# Patient Record
Sex: Female | Born: 1965 | Race: White | Hispanic: No | Marital: Married | Smoking: Former smoker
Health system: Southern US, Community
[De-identification: ages and names within clinical notes are randomized; demographics above are authoritative.]

## PROBLEM LIST (undated history)

## (undated) DIAGNOSIS — F419 Anxiety disorder, unspecified: Secondary | ICD-10-CM

## (undated) DIAGNOSIS — O24419 Gestational diabetes mellitus in pregnancy, unspecified control: Secondary | ICD-10-CM

## (undated) DIAGNOSIS — D219 Benign neoplasm of connective and other soft tissue, unspecified: Secondary | ICD-10-CM

## (undated) HISTORY — DX: Gestational diabetes mellitus in pregnancy, unspecified control: O24.419

## (undated) HISTORY — DX: Anxiety disorder, unspecified: F41.9

## (undated) HISTORY — PX: SKIN GRAFT: SHX250

## (undated) HISTORY — PX: WISDOM TOOTH EXTRACTION: SHX21

## (undated) HISTORY — PX: BREAST ENHANCEMENT SURGERY: SHX7

## (undated) HISTORY — PX: OTHER SURGICAL HISTORY: SHX169

## (undated) HISTORY — PX: ENDOMETRIAL ABLATION W/ NOVASURE: SUR434

## (undated) HISTORY — DX: Benign neoplasm of connective and other soft tissue, unspecified: D21.9

---

## 2001-06-23 ENCOUNTER — Other Ambulatory Visit: Admission: RE | Admit: 2001-06-23 | Discharge: 2001-06-23 | Payer: Self-pay | Admitting: Gynecology

## 2002-02-25 ENCOUNTER — Inpatient Hospital Stay (HOSPITAL_COMMUNITY): Admission: AD | Admit: 2002-02-25 | Discharge: 2002-02-25 | Payer: Self-pay | Admitting: Obstetrics & Gynecology

## 2002-04-28 ENCOUNTER — Encounter: Admission: RE | Admit: 2002-04-28 | Discharge: 2002-04-28 | Payer: Self-pay | Admitting: Obstetrics and Gynecology

## 2002-05-26 ENCOUNTER — Inpatient Hospital Stay (HOSPITAL_COMMUNITY): Admission: AD | Admit: 2002-05-26 | Discharge: 2002-05-28 | Payer: Self-pay | Admitting: Obstetrics and Gynecology

## 2002-06-24 ENCOUNTER — Other Ambulatory Visit: Admission: RE | Admit: 2002-06-24 | Discharge: 2002-06-24 | Payer: Self-pay | Admitting: Obstetrics and Gynecology

## 2003-11-25 ENCOUNTER — Other Ambulatory Visit: Admission: RE | Admit: 2003-11-25 | Discharge: 2003-11-25 | Payer: Self-pay | Admitting: Obstetrics and Gynecology

## 2005-01-01 ENCOUNTER — Other Ambulatory Visit: Admission: RE | Admit: 2005-01-01 | Discharge: 2005-01-01 | Payer: Self-pay | Admitting: Obstetrics and Gynecology

## 2006-11-11 ENCOUNTER — Ambulatory Visit: Payer: Self-pay | Admitting: Family Medicine

## 2006-11-11 DIAGNOSIS — R51 Headache: Secondary | ICD-10-CM

## 2006-11-11 DIAGNOSIS — R519 Headache, unspecified: Secondary | ICD-10-CM | POA: Insufficient documentation

## 2007-08-15 ENCOUNTER — Ambulatory Visit: Payer: Self-pay | Admitting: Family Medicine

## 2007-08-18 ENCOUNTER — Ambulatory Visit: Payer: Self-pay | Admitting: Family Medicine

## 2007-10-10 ENCOUNTER — Encounter: Payer: Self-pay | Admitting: Obstetrics and Gynecology

## 2007-10-10 ENCOUNTER — Ambulatory Visit: Payer: Self-pay | Admitting: Obstetrics and Gynecology

## 2008-10-22 ENCOUNTER — Ambulatory Visit: Payer: Self-pay | Admitting: Obstetrics and Gynecology

## 2008-10-22 ENCOUNTER — Encounter: Payer: Self-pay | Admitting: Obstetrics and Gynecology

## 2009-06-09 ENCOUNTER — Ambulatory Visit: Payer: Self-pay | Admitting: Family Medicine

## 2009-06-13 ENCOUNTER — Ambulatory Visit: Payer: Self-pay | Admitting: Family Medicine

## 2009-07-12 ENCOUNTER — Ambulatory Visit: Payer: Self-pay | Admitting: Family Medicine

## 2009-07-12 LAB — CONVERTED CEMR LAB
Albumin/Creatinine Ratio, Urine, POC: <30
Bilirubin Urine: NEGATIVE
Blood in Urine, dipstick: NEGATIVE
Creatinine,U: 200 mg/dL
Glucose, Urine, Semiquant: NEGATIVE
Ketones, urine, test strip: NEGATIVE
Microalbumin U total vol: 10 mg/L
Nitrite: NEGATIVE
Protein, U semiquant: NEGATIVE
Specific Gravity, Urine: 1.02
Urobilinogen, UA: 0.2
pH: 5.5

## 2009-11-02 ENCOUNTER — Ambulatory Visit: Payer: Self-pay | Admitting: Obstetrics & Gynecology

## 2009-11-03 ENCOUNTER — Encounter: Payer: Self-pay | Admitting: Obstetrics & Gynecology

## 2009-11-03 LAB — CONVERTED CEMR LAB: TSH: 1.738 microintl units/mL (ref 0.350–4.500)

## 2010-03-29 ENCOUNTER — Ambulatory Visit: Payer: Self-pay | Admitting: Obstetrics & Gynecology

## 2010-03-29 LAB — CONVERTED CEMR LAB
Basophils Absolute: 0 10*3/uL (ref 0.0–0.1)
Basophils Relative: 0 % (ref 0–1)
Chlamydia, Swab/Urine, PCR: NEGATIVE
Eosinophils Absolute: 0 10*3/uL (ref 0.0–0.7)
Eosinophils Relative: 0 % (ref 0–5)
GC Probe Amp, Urine: NEGATIVE
HCT: 39.7 % (ref 36.0–46.0)
Hemoglobin: 12.6 g/dL (ref 12.0–15.0)
Lymphocytes Relative: 22 % (ref 12–46)
Lymphs Abs: 1.1 10*3/uL (ref 0.7–4.0)
MCHC: 31.7 g/dL (ref 30.0–36.0)
MCV: 97.5 fL (ref 78.0–100.0)
Monocytes Absolute: 0.4 10*3/uL (ref 0.1–1.0)
Monocytes Relative: 8 % (ref 3–12)
Neutro Abs: 3.4 10*3/uL (ref 1.7–7.7)
Neutrophils Relative %: 70 % (ref 43–77)
Platelets: 165 10*3/uL (ref 150–400)
RBC: 4.07 M/uL (ref 3.87–5.11)
RDW: 14 % (ref 11.5–15.5)
TSH: 2.62 microintl units/mL (ref 0.350–4.500)
WBC: 4.9 10*3/uL (ref 4.0–10.5)

## 2010-03-30 ENCOUNTER — Encounter: Admission: RE | Admit: 2010-03-30 | Discharge: 2010-03-30 | Payer: Self-pay | Admitting: Obstetrics & Gynecology

## 2010-04-05 ENCOUNTER — Ambulatory Visit: Payer: Self-pay | Admitting: Obstetrics & Gynecology

## 2010-04-25 ENCOUNTER — Ambulatory Visit: Payer: Self-pay | Admitting: Obstetrics & Gynecology

## 2010-04-25 ENCOUNTER — Ambulatory Visit (HOSPITAL_COMMUNITY): Admission: RE | Admit: 2010-04-25 | Discharge: 2010-04-25 | Payer: Self-pay | Admitting: Obstetrics & Gynecology

## 2010-05-09 ENCOUNTER — Ambulatory Visit: Payer: Self-pay | Admitting: Family Medicine

## 2010-07-05 ENCOUNTER — Ambulatory Visit: Payer: Self-pay | Admitting: Obstetrics & Gynecology

## 2010-09-16 ENCOUNTER — Encounter: Payer: Self-pay | Admitting: Obstetrics and Gynecology

## 2010-09-17 ENCOUNTER — Encounter: Payer: Self-pay | Admitting: Obstetrics & Gynecology

## 2010-09-26 NOTE — Assessment & Plan Note (Signed)
Summary: TB skin test , flu shot-jr  Nurse Visit   Vitals Entered By: Payton Spark CMA (May 09, 2010 9:08 AM)  Allergies: No Known Drug Allergies  Immunizations Administered:  PPD Skin Test:    Vaccine Type: PPD    Site: left forearm    Dose: 0.1 ml    Route: ID    Given by: Payton Spark CMA  Orders Added: 1)  TB Skin Test [86580] 2)  Admin 1st Vaccine [90471] 3)  Admin 1st Vaccine [90471] 4)  Flu Vaccine 60yrs + [98119]   Flu Vaccine Consent Questions     Do you have a history of severe allergic reactions to this vaccine? no    Any prior history of allergic reactions to egg and/or gelatin? no    Do you have a sensitivity to the preservative Thimersol? no    Do you have a past history of Guillan-Barre Syndrome? no    Do you currently have an acute febrile illness? no    Have you ever had a severe reaction to latex? no    Vaccine information given and explained to patient? yes    Are you currently pregnant? no    Lot Number:[id number]   Exp Date:02/24/2011   Site Given  Left Deltoid IM   Appended Document: TB skin test , flu shot-jr   PPD Results    Date of reading: 05/11/2010    Results: < 5mm    Interpretation: negative

## 2010-11-09 LAB — CBC
HCT: 36.8 % (ref 36.0–46.0)
MCH: 32.6 pg (ref 26.0–34.0)
MCHC: 34 g/dL (ref 30.0–36.0)
MCV: 95.9 fL (ref 78.0–100.0)
Platelets: 151 10*3/uL (ref 150–400)
RDW: 13.4 % (ref 11.5–15.5)
WBC: 4.3 10*3/uL (ref 4.0–10.5)

## 2011-01-09 NOTE — Assessment & Plan Note (Signed)
NAME:  Ashley Barton, Ashley Barton NO.:  [id number]   MEDICAL RECORD NO.:  [id number]          PATIENT TYPE:  POB   LOCATION:  [facility] at [place]         FACILITY:  [facility]   PHYSICIAN:  Allie Bossier, MD        DATE OF BIRTH:  Apr 23, 1966   DATE OF SERVICE:  03/29/2010                                  CLINIC NOTE   [name] is a 45 year old married white, G2, P2, she has sons, 63 and 38  years of age.  She comes in because over the last 6 months to a year her  periods have become much more heavy.  She says that they used to be 4-5  days in length, now they are 7-10 days in length.  Now very heavy, when  she stands she feels clot.  She has been doing research and has decided  she would like an endometrial ablation. On exam, her uterus is normal  size and shape, anteverted mobile.  Her adnexa are nontender, no masses.  I have explained that we will need to do a workup prior to scheduling  any surgery.  This workup for menorrhagia will include a CBC, TSH,  cervical cultures, and a GYN ultrasound.  She is in agreement and I will  see her back when these results are available.      Allie Bossier, MD     MCD/MEDQ  D:  03/29/2010  T:  03/29/2010  Job:  [id number]

## 2011-01-09 NOTE — Assessment & Plan Note (Signed)
NAME:  Ashley Barton, Ashley Barton                 ACCOUNT NO.:  [id number]   MEDICAL RECORD NO.:  [id number]          PATIENT TYPE:  POB   LOCATION:  [facility] at [place]         FACILITY:  [facility]   PHYSICIAN:  Allie Bossier, MD        DATE OF BIRTH:  October 27, 1965   DATE OF SERVICE:  04/05/2010                                  CLINIC NOTE   Ms. Spivey is a 45 year old married white G2, P2.  Her sons are 44 and 66  years of age.  She has been seen at the [place] office for several  years.  Earlier this month, she came in complaining that her periods  over the last 6 months have begun to become much more heavy.  Initially,  they used to be 4-5 days in length and now they are 7-10 days in length.  When she stands, she feels clots coming out.  She had done her research  before she came in and wishes to have an endometrial ablation.  At that  time, I began a workup for menorrhagia and found that her hemoglobin is  on the lower limits of normal at 12.6.  Her TSH is normal.  Her cervical  cultures are negative, and a GYN ultrasound shows a 2.5-cm left fundal  fibroid as well as an endometrial polyp.  I have recommended that she  have a D and C and hysteroscopy and then a NovaSure endometrial  ablation.  She understands the risks associated with surgery and wishes  to proceed.   PAST MEDICAL HISTORY:  Hemorrhoids and anxiety.   REVIEW OF SYSTEMS:  She is married for the last 18 years.  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  Her husband is  status post vasectomy.   PREVIOUS SURGICAL HISTORY:  Wisdom teeth extraction, skin graft to her  right palm, and saline breast implants in 2006.   No drug allergies.  No latex allergies.  No food allergies.   PHYSICAL EXAMINATION:  GENERAL:  Well-nourished, well-hydrated, and very  pleasant white female.  VITAL SIGNS:  Height 5 feet 2 inches, weight 118, blood pressure 98/65,  pulse 89.  HEENT:  Normal.  HEART:  Regular rate and rhythm.  LUNGS:  Clear to auscultation bilaterally.  ABDOMEN:  Benign, scaphoid.  BREASTS:  Normal bilaterally.  EXTERNAL GENITALIA:  Normal.  Bimanual exam; her uterus is normal size  and shape, anteverted and mobile.  Her adnexa are nontender and no  masses.   ASSESSMENT AND PLAN:  Uterine polyp and menorrhagia.  We will plan for a  D and C, hysteroscopy, and NovaSure endometrial ablation.      Allie Bossier, MD     MCD/MEDQ  D:  04/05/2010  T:  04/06/2010  Job:  [id number]

## 2011-01-09 NOTE — Assessment & Plan Note (Signed)
NAME:  Ashley Barton, Ashley Barton                 ACCOUNT NO.:  [id number]   MEDICAL RECORD NO.:  [id number]          PATIENT TYPE:  POB   LOCATION:  [facility] [place]         FACILITY:  [facility]   PHYSICIAN:  Caren Griffins, CNM       DATE OF BIRTH:  [birth year]   DATE OF SERVICE:  10/10/2007                                  CLINIC NOTE   REASON FOR VISIT:  Annual GYN.   HISTORY:  Ashley Barton is a 45 year old G2, P2 who is here for yearly exam.  She  has some concerns with her hemorrhoids which she says protrude about  half the time, and she manually replaces after bowel movement.  Of note,  she had a hemorrhoidectomy about 5 years ago.  She denies rectal  bleeding.  She is also concerned that she has slight urinary leaking  when she coughs or laughs; however, she does not routinely have  embarrassment or problems with this and does not wear a pad.  She does  not do Kegel exercises.   ALLERGIES:  None.   CURRENT MEDICATIONS:  None, other than occasional use of a multivitamin  and calcium which she is not taking on a regular basis.   IMMUNIZATIONS:  Usual childhood immunizations.  She has had flu shot  this year.   MENSTRUAL HISTORY:  Menses 13 x 28 x 7 with moderate flow.  Rare  dyspareunia.  She does note a lot of mid cycle clear mucoid discharge.   OB HISTORY:  Children are aged 5 and 103 and well.  SVD x2.   CONTRACEPTIVE HISTORY:  Husband has had a vasectomy.   GYN HISTORY:  Last Pap smear was normal in 2007.  She has had all normal  Paps, no STIs.  She had a normal mammogram December of 2006.   SURGERIES:  She had breast implants done January 2007.  Also  hemorrhoidectomy after her second delivery.   FAMILY HISTORY:  Unknown as she is adopted.   PAST MEDICAL HISTORY:  All negative.   SOCIAL HISTORY:  Lives with husband and 2 children.  Does not work  outside the home.  Negative for tobacco, alcohol, or illicit drugs.  No  history of abuse.   Her review of systems are negative except as  noted above.   PHYSICAL EXAMINATION:  VITAL SIGNS:  BP 98/64, weight 111, height 62  inches.  BMI normal.  GENERAL:  WN WD, pleasant female, NAD.  HEENT:  Good dentition, no thyromegaly, no lymphadenopathy.  CORONARY:  RRR without murmur.  LUNGS:  Clear to auscultation bilateral.  BREASTS:  Saline implants are present.  There is no lymphadenopathy, no  mass appreciated, no nipple discharge.  ABDOMEN:  Soft, nontender, no organomegaly.  EXTREMITIES:  No edema or varicosities.  PELVIC:  NEFG.  Negative BSU.  Vagina, good support, fair tone.  Physiologic discharge.  Cervix, slight ectropion.  Parous os, no  lesions.  Bimanual:  Uterus NSSP, no adnexal tenderness or masses.   ASSESSMENT:  Normal GYN.  Hemorrhoids by history.   PLAN:  The patient is scheduled for a mammogram.  She is concerned about  breast compression effects on her implants, and  will discuss this with  the technician.  Pap smear is sent.  Discussed treatment for hemorrhoids  at length, stressing dietary measures to keep stools soft.  Also may use  fiber supplements or over-the-counter stool softeners.  Discussed Anusol  or Proctofoam HC use.  Also instructed in Kegel exercises and bladder  training to void every 2 hours if possible.  Discussed her diet which is  essentially good, but she will increase fruits and vegetables.  Discuss  initiation of some kind of cardio strength exercise regimen.  Also seat  belt use, use of sunscreen, and addition of multivitamin with D3 and  calcium supplementation to her diet.           ______________________________  Caren Griffins, CNM     DP/MEDQ  D:  10/10/2007  T:  10/11/2007  Job:  [id number]

## 2011-01-09 NOTE — Assessment & Plan Note (Signed)
NAME:  Ashley Barton, Ashley Barton                 ACCOUNT NO.:  [id number]   MEDICAL RECORD NO.:  [id number]          PATIENT TYPE:  POB   LOCATION:  [facility] [place]         FACILITY:  [facility]   PHYSICIAN:  Allie Bossier, MD        DATE OF BIRTH:  1966/08/25   DATE OF SERVICE:  11/02/2009                                  CLINIC NOTE   Ashley Barton is a 45 year old married white gravida 2, para 2.  She has 50 and 72-  year-old sons.  She comes in here for annual exam.  She has 2  complaints, one is that she has episodes of not being able to regulate  her temperature, this happens approximately a daily basis and it is very  short time frame.  So, sometimes she will feel like a hot flash and  other times she will feel chilled.  The other complaint is that of  agitation.  She feels like she is not the type of mother she wants to  be currently.  She thinks this is probably due to stress.  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   PAST MEDICAL HISTORY:  Hemorrhoids.   PAST SURGICAL HISTORY:  Wisdom teeth extraction, skin graft to her right  thumb, and saline breast implants in 2006.   REVIEW OF SYSTEMS:  She is married for 18 years.  She is a Futures trader and  a Physicist, medical.  Her husband has had a vasectomy.   MEDICATIONS:  She takes vitamin B and vitamin D.   ALLERGIES:  No known drug allergies.  No latex allergies.   FAMILY HISTORY:  She is adopted.   PHYSICAL EXAMINATION:  GENERAL:  Well-nourished, well-hydrated pleasant  white female.  VITAL SIGNS:  Height 5 feet 2 inches, weight 111, blood pressure 100/62,  pulse 81.  HEENT:  Normal.  BREASTS:  Normal with implants.  ABDOMEN:  Benign, scaphoid.  No palpable hepatosplenomegaly.  PELVIC:  External genitalia, no lesions.  Cervix; parous, no lesions.  Normal discharge in her vagina.  Uterus is normal size and shape,  anteverted and mobile.  Adnexa nontender.  No masses.   ASSESSMENT AND PLAN:  1. Annual exam, checked Pap smear.   Recommend self-breast and self-      vulvar exams.  2. Agitation.  I have recommended that if she had the time, I would      suggest yoga, meditation or just plain exercise to relieve the      stress.  However, she says that she does not have any time for this      and would prefer to try a medical approach.  I have given her      prescription for Prozac 20 mg to be taken every morning and I will      see her in 2 months to see if this is suiting her.  With regard to      her hot/cold episodes, I am checking the TSH.  If that is normal, I      have given her reassurance that she is normal.      Allie Bossier, MD  MCD/MEDQ  D:  11/02/2009  T:  11/03/2009  Job:  [id number]

## 2011-01-09 NOTE — Assessment & Plan Note (Signed)
NAME:  Ashley Barton, Ashley Barton                 ACCOUNT NO.:  [id number]   MEDICAL RECORD NO.:  [id number]          PATIENT TYPE:  POB   LOCATION:  [facility] [place]         FACILITY:  [facility]   PHYSICIAN:  Caren Griffins, CNM       DATE OF BIRTH:  25-Aug-1966   DATE OF SERVICE:  10/22/2008                                  CLINIC NOTE   HISTORY:  Ashley Barton is a 45 year old G2, P2 who has no complaints and is here  for annual Pap and physical.  She was seen here a year ago for her  annual exam, at which time, she had some issues with hemorrhoids, but  denies any problems now with that.  She is currently in nursing school.  Her children age 60 and 6 are both doing well.  She denies any GYN  complaints, has poor relational issues.  LMP was normal October 11, 2008.  Her cycles are still very regular.  She is taking a multivitamin,  calcium, and vitamin D, and does exercise as much as possible with her  busy schedule.  Of note, she declined to have a mammogram and is aware  of the new guidelines.   PHYSICAL EXAMINATION:  VITAL SIGNS:  Pulse 105, BP 115/68, weight 105.  GENERAL:  WN, WD, NAD.  HEENT:  No thyromegaly.  CORONARY:  RRR without murmur.  LUNGS:  CTA bilateral.  BREASTS:  No appreciated masses.  No nipple discharge.  No  lymphadenopathy.  Saline implants at present.  ABDOMEN:  Soft, nontender.  No organomegaly.  EXTREMITIES:  No edema.  PELVIC:  NEG.  BSU negative.  Vagina good support and tone.  Physiologic  discharge.  Cervix without lesions, parous os.  Bimanual uterus NSSP.  No adnexal tenderness or masses.   ASSESSMENT:  Normal gynecologic exam.   PLAN:  Pap smear was sent.  Discussed diet to increase red meat and  empty calorie foods.  Cardio and strength exercises.  Continue vitamins  and D3.  Continue seatbelt use.  We will see her again in a year for an  annual GYN exam and we will discuss whether she will do a Pap smear at  that visit.           ______________________________  Caren Griffins, CNM     DP/MEDQ  D:  10/22/2008  T:  10/22/2008  Job:  [id number]

## 2011-03-18 ENCOUNTER — Encounter: Payer: Self-pay | Admitting: Family Medicine

## 2011-03-20 ENCOUNTER — Ambulatory Visit: Payer: Self-pay | Admitting: Family Medicine

## 2011-03-21 ENCOUNTER — Ambulatory Visit
Admission: RE | Admit: 2011-03-21 | Discharge: 2011-03-21 | Disposition: A | Discharge status: Home / Self Care | Payer: Self-pay | Source: Ambulatory Visit | Attending: Sports Medicine | Admitting: Sports Medicine

## 2011-03-21 ENCOUNTER — Other Ambulatory Visit: Payer: Self-pay | Admitting: Sports Medicine

## 2011-03-21 DIAGNOSIS — M79643 Pain in unspecified hand: Secondary | ICD-10-CM

## 2011-05-20 IMAGING — US US TRANSVAGINAL NON-OB
1 series · 13 of 25 positions shown · non-contrast
Comparison: None.

CLINICAL DATA: Painful menses.  LMP 03/15/2010



[Series 1: us transvaginal non-ob · 0.26mm/px · 13 of 53 slices shown]
[im 1/53]
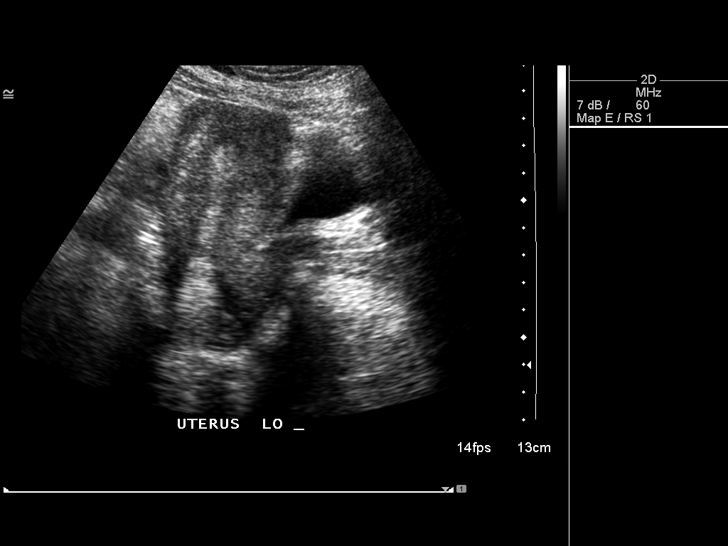
[im 5/53]
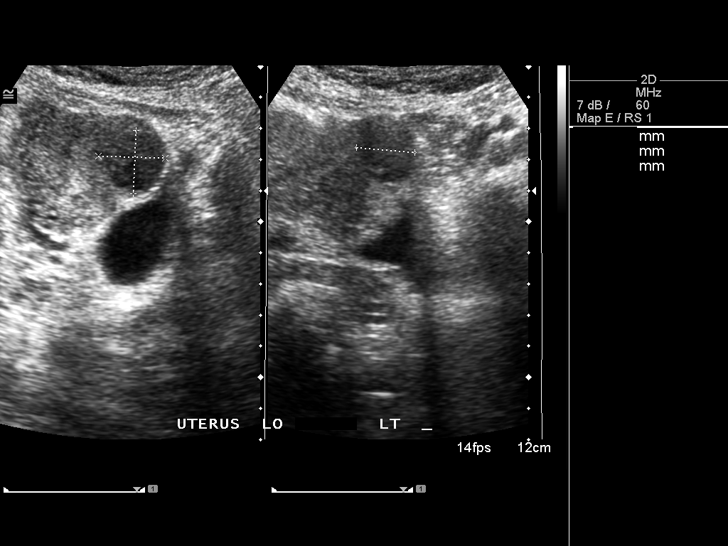
[im 9/53]
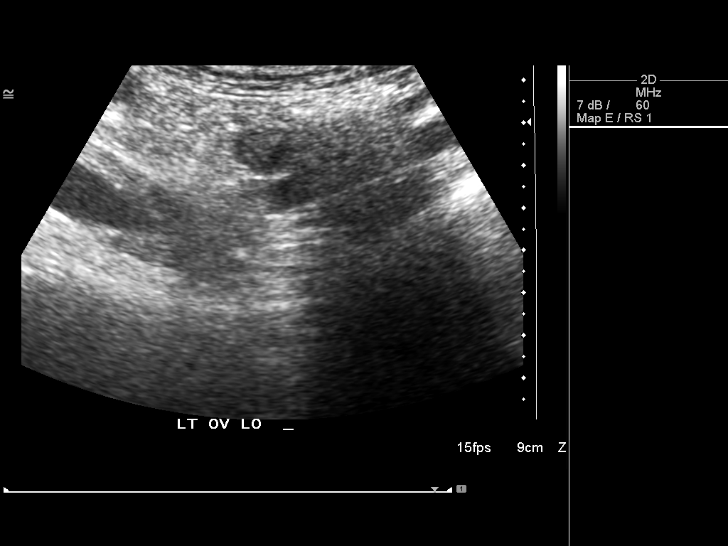
[im 14/53]
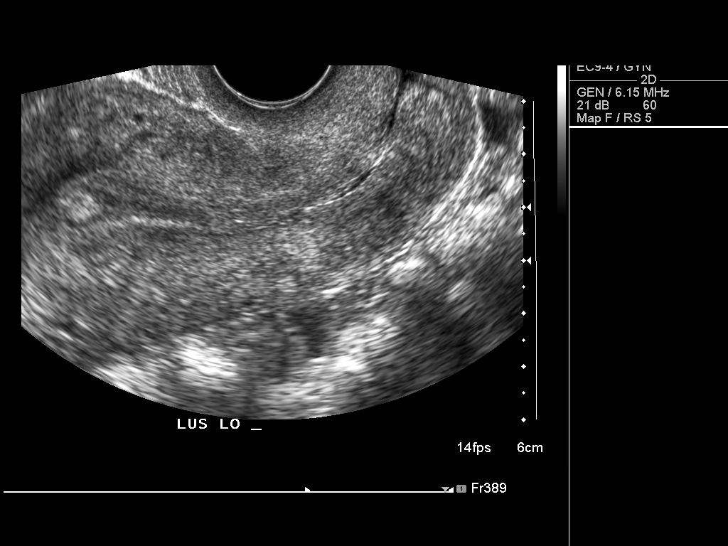
[im 18/53]
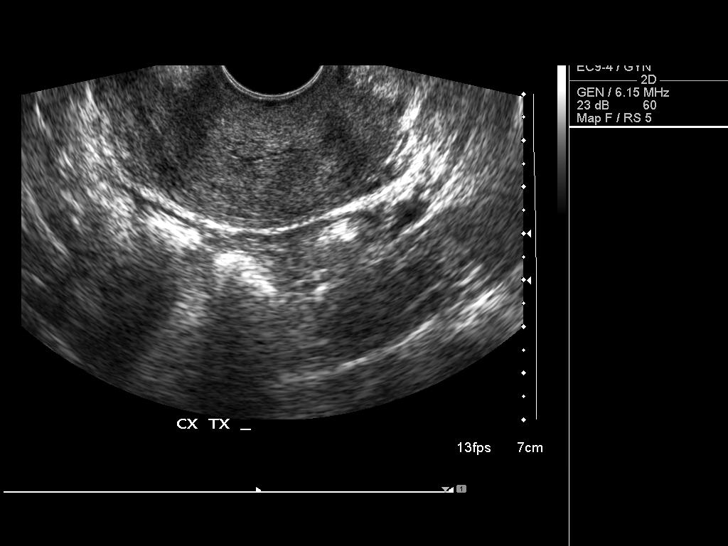
[im 22/53]
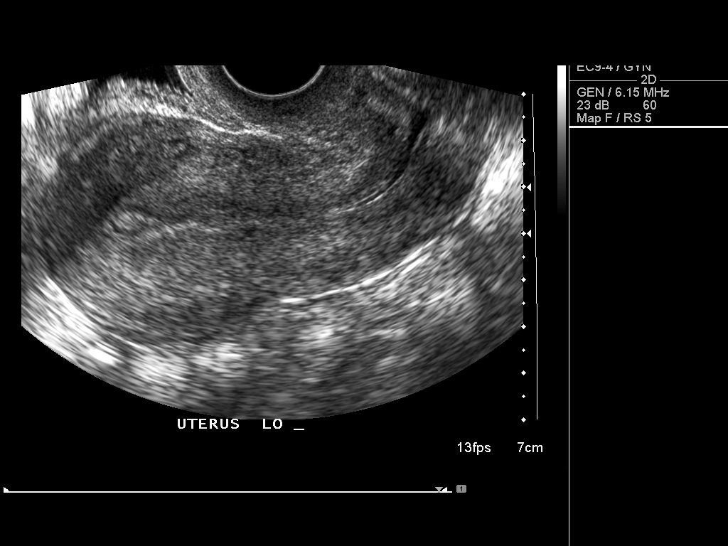
[im 27/53]
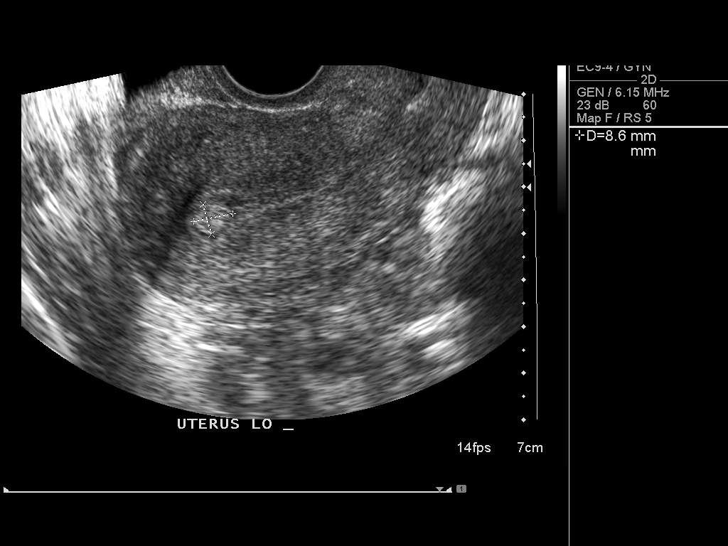
[im 31/53]
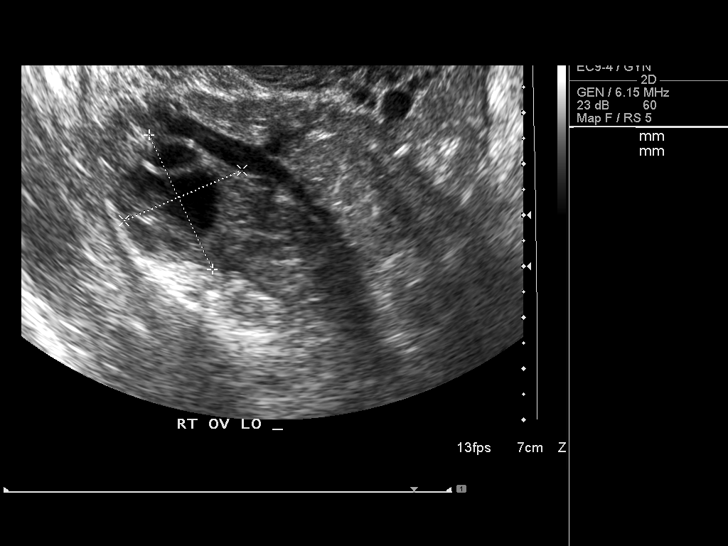
[im 35/53]
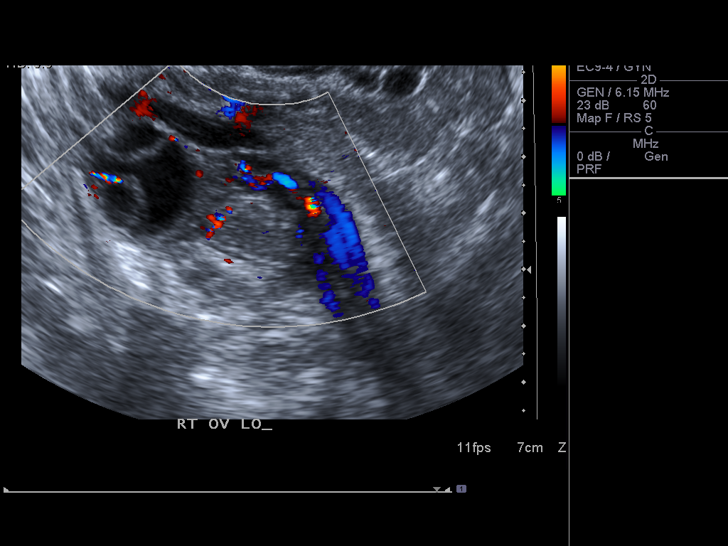
[im 40/53]
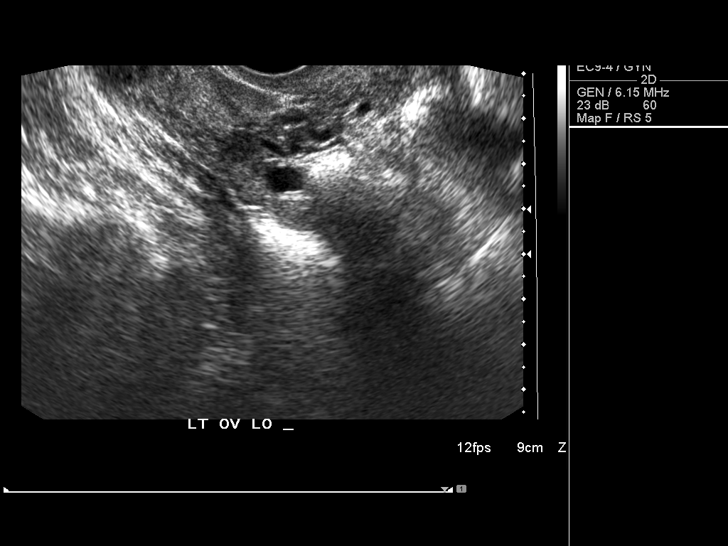
[im 44/53]
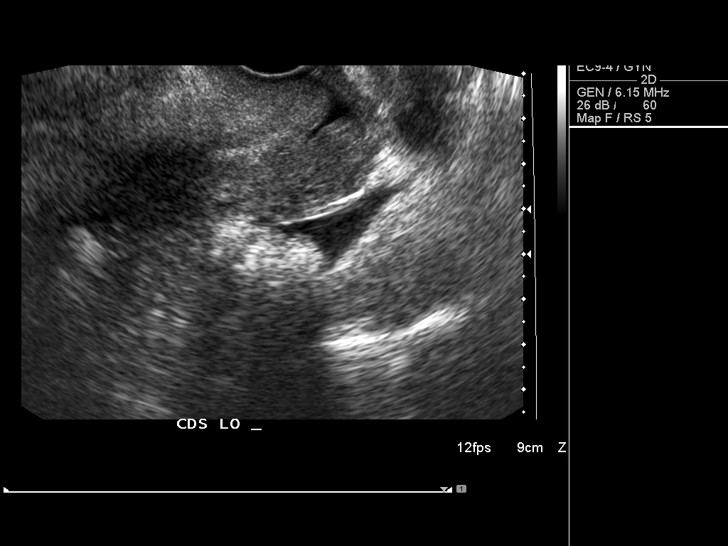
[im 48/53]
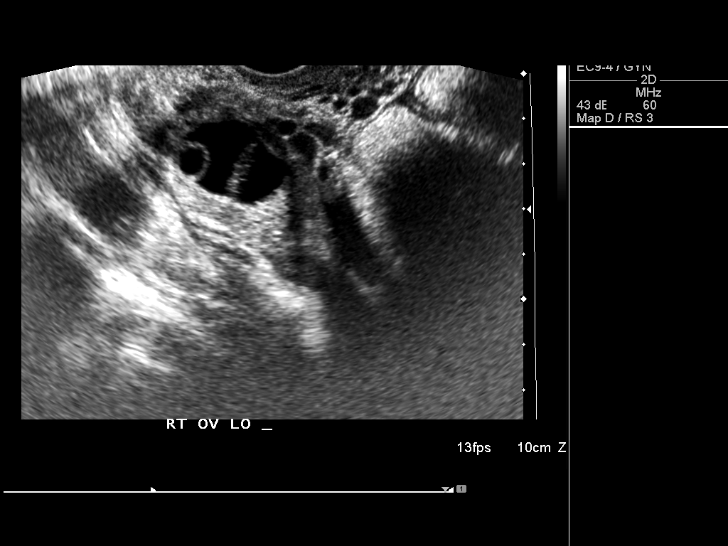
[im 53/53]
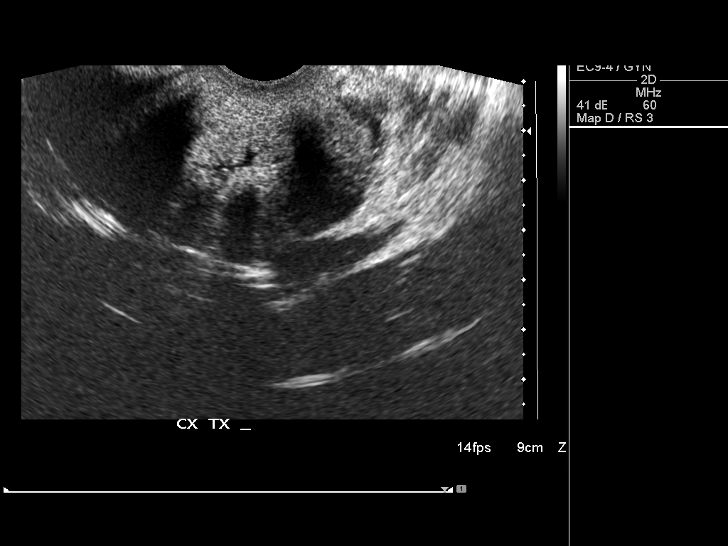

[13 of 25 positions shown; findings below may reference images not displayed]

FINDINGS: Uterus demonstrates a sagittal length of 9.6 cm, an AP depth of
cm and a transverse width of 6.3 cm.  A focal fibroid is identified
in the left fundal region measuring 2.4 x 2.2 x 1.9 cm and mural in
location.  The remainder of the myometrium is homogeneous.

Endometrium has a tri-layered appearance with an AP width of
cm.  An area of focal increased echogenicity is identified in the
fundal region measuring 1.2 x 0.9 x 0.7 cm.  This demonstrates some
intralesional flow  and is strongly suspicious for focal polyp.
This can be confirmed with sono hysterography if desired.

Right Ovary has a normal appearance measuring 2.9 x 2.9 x 2.5 cm

Left Ovary has a normal appearance measuring 2.5 x 1.6 x 1.6 cm.

Other Findings:  A small amount of simple free fluid is noted in
the cul-de-sac.
IMPRESSION: Focal fibroid with size and location as noted above.

Area of focal thickening in the endometrium strongly suspicious for
a polyp.  This can be confirmed with sonohysterography and the
point of attachment to the adjacent endometrium can be determined.

Normal ovaries.

## 2011-07-16 ENCOUNTER — Encounter: Payer: Self-pay | Admitting: *Deleted

## 2011-07-16 ENCOUNTER — Other Ambulatory Visit: Payer: Self-pay | Admitting: *Deleted

## 2011-07-16 DIAGNOSIS — F419 Anxiety disorder, unspecified: Secondary | ICD-10-CM

## 2011-07-16 MED ORDER — FLUOXETINE HCL 20 MG PO CAPS: 20.0000 mg | ORAL_CAPSULE | Freq: Every day | ORAL | Status: DC

## 2011-08-01 ENCOUNTER — Encounter: Payer: Self-pay | Admitting: Obstetrics & Gynecology

## 2011-08-01 ENCOUNTER — Ambulatory Visit (INDEPENDENT_AMBULATORY_CARE_PROVIDER_SITE_OTHER): Payer: 59 | Admitting: Obstetrics & Gynecology

## 2011-08-01 VITALS — BP 118/79 | HR 81 | Temp 97.7°F | Resp 16 | Ht 62.0 in | Wt 132.0 lb

## 2011-08-01 DIAGNOSIS — Z01419 Encounter for gynecological examination (general) (routine) without abnormal findings: Secondary | ICD-10-CM

## 2011-08-01 DIAGNOSIS — IMO0001 Reserved for inherently not codable concepts without codable children: Secondary | ICD-10-CM

## 2011-08-01 NOTE — Progress Notes (Signed)
  Subjective:     Ashley Barton is a 45 y.o. female here for a routine exam.  Current complaints: none.  Personal health questionnaire reviewed: yes.   Gynecologic History Patient's last menstrual period was 07/02/2011. Last Pap: 2011. Results were: normal Last mammogram: 2012. Results were: normal  Obstetric History OB History    Grav Para Term Preterm Abortions TAB SAB Ect Mult Living   2 2        2      # Outc Date GA Lbr Len/2nd Wgt Sex Del Anes PTL Lv   1 PAR            2 PAR                The following portions of the patient's history were reviewed and updated as appropriate: allergies, current medications, past family history, past medical history, past social history, past surgical history and problem list.  Review of Systems Constitutional: negative Ears, nose, mouth, throat, and face: negative Respiratory: negative Cardiovascular: negative Gastrointestinal: negative Genitourinary:negative Integument/breast: negative Behavioral/Psych: negative    Objective:    BP 118/79  Pulse 81  Temp(Src) 97.7 F (36.5 C) (Oral)  Resp 16  Ht 5\' 2"  (1.575 m)  Wt 59.875 kg (132 lb)  BMI 24.14 kg/m2  LMP 07/02/2011 General appearance: alert, cooperative and no distress Head: Normocephalic, without obvious abnormality, atraumatic Throat: lips, mucosa, and tongue normal; teeth and gums normal Neck: no adenopathy, supple, symmetrical, trachea midline and thyroid not enlarged, symmetric, no tenderness/mass/nodules Lungs: clear to auscultation bilaterally Breasts: normal appearance, no masses or tenderness Heart: regular rate and rhythm Abdomen: soft, non-tender; bowel sounds normal; no masses,  no organomegaly Pelvic: cervix normal in appearance, external genitalia normal, no adnexal masses or tenderness, no cervical motion tenderness, rectovaginal septum normal, uterus normal size, shape, and consistency and vagina normal without discharge Extremities: extremities normal,  atraumatic, no cyanosis or edema Skin: Skin color, texture, turgor normal. No rashes or lesions    Assessment:    Healthy female exam.    Plan:   No pap smear indicated, low risk RTC 1 yr. 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

## 2011-08-01 NOTE — Patient Instructions (Signed)
Calcium Intake Recommendations Age group / Amount of calcium to consume daily, in milligrams (mg)  0 to 6 months / 210 mg   7 to 12 months / 270 mg   1 to 3 years / 500 mg   4 to 8 years / 800 mg   9 to 18 years / 1,300 mg   19 to 50 years / 1,000 mg   51 to 70+ years / 1,200 mg   Pregnant and nursing, under 19 years / 1,300 mg   Pregnant and nursing, over 19 years / 1,000 mg  Document Released: 03/27/2004 Document Revised: 04/25/2011 Document Reviewed: 08/13/2005 [facility] Patient Information 2012 [place], [place].

## 2011-08-08 ENCOUNTER — Other Ambulatory Visit: Payer: Self-pay | Admitting: *Deleted

## 2011-08-08 DIAGNOSIS — F419 Anxiety disorder, unspecified: Secondary | ICD-10-CM

## 2011-08-08 MED ORDER — FLUOXETINE HCL 20 MG PO CAPS: 20.0000 mg | ORAL_CAPSULE | Freq: Every day | ORAL | Status: DC

## 2012-03-21 ENCOUNTER — Other Ambulatory Visit: Payer: Self-pay | Admitting: *Deleted

## 2012-03-21 DIAGNOSIS — F419 Anxiety disorder, unspecified: Secondary | ICD-10-CM

## 2012-03-21 MED ORDER — FLUOXETINE HCL 20 MG PO CAPS: 20.0000 mg | ORAL_CAPSULE | Freq: Every day | ORAL | Status: DC

## 2012-05-10 IMAGING — CR DG HAND 2V*R*
1 series · 1 of 1 positions shown · non-contrast
Comparison: None.

CLINICAL DATA: Swelling of the right second digit, no trauma.

RIGHT HAND - 2 VIEW

[view not recorded]
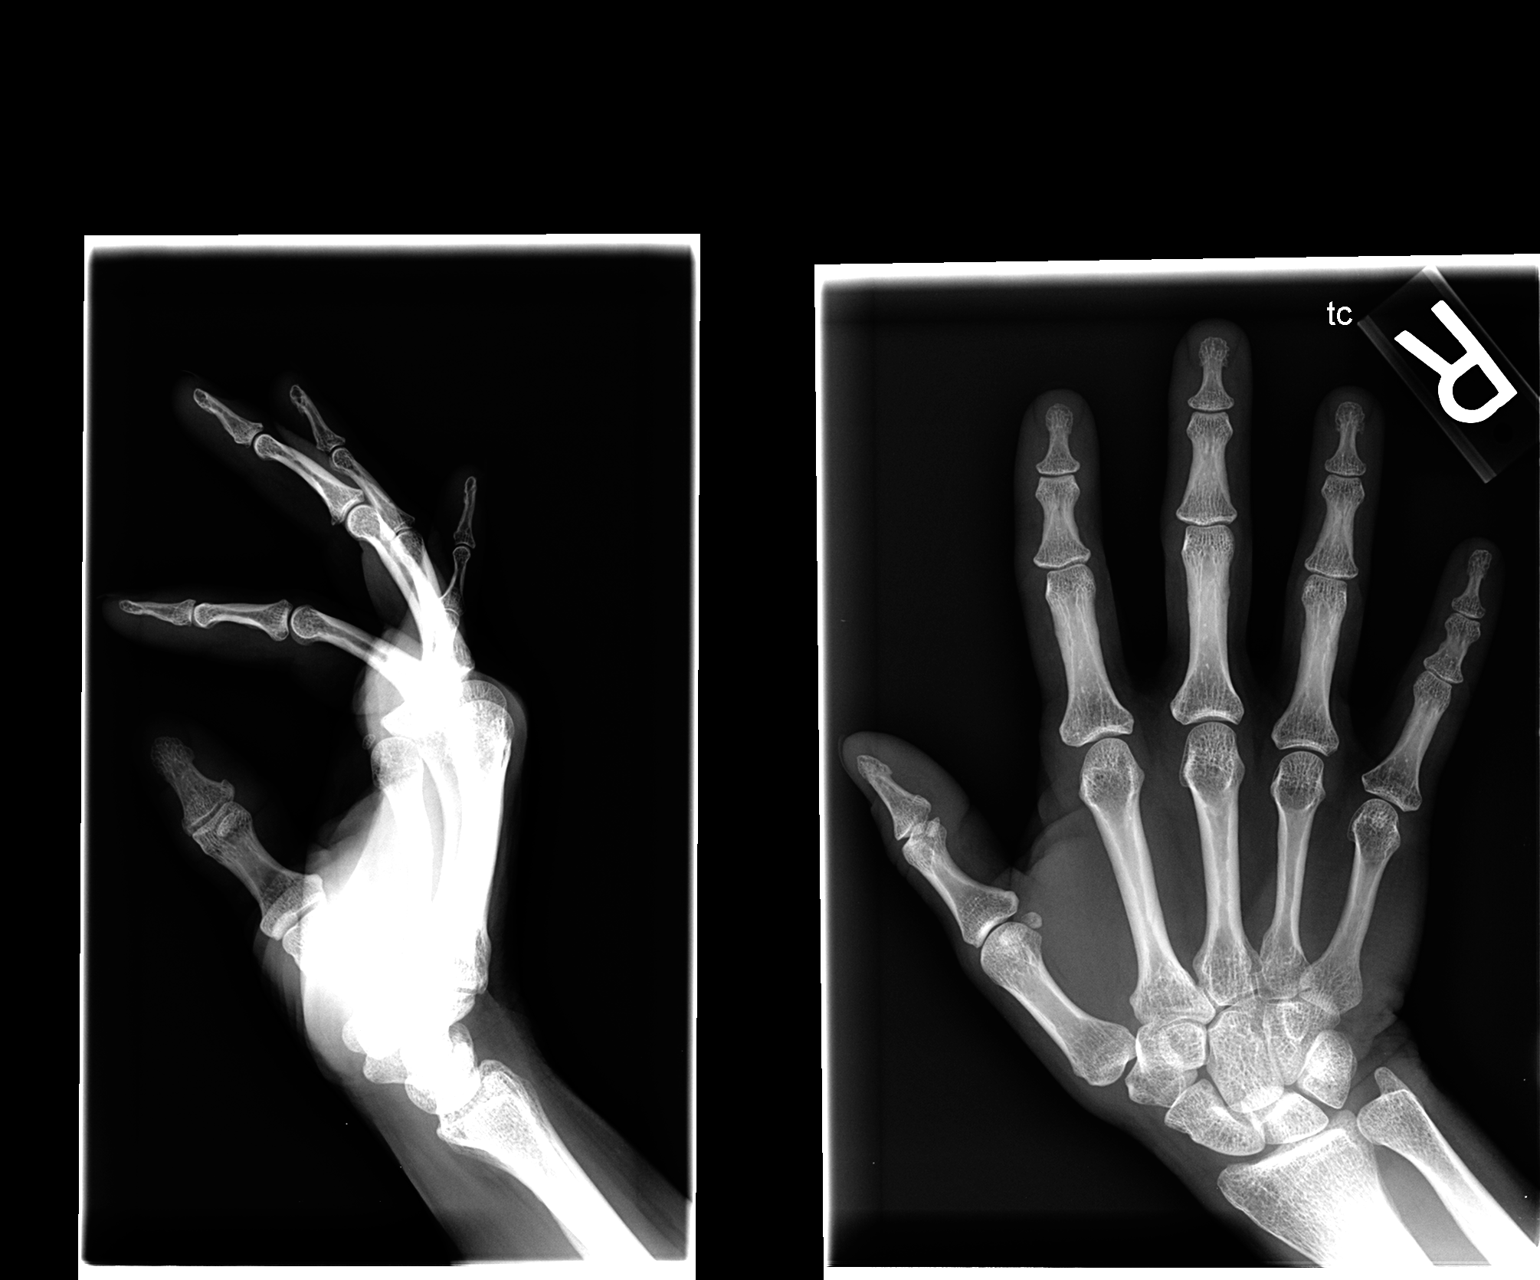

[1 of 1 positions shown; findings below may reference images not displayed]

FINDINGS: The radiocarpal joint space appears normal.  The ulnar
styloid is intact and the carpal bones are in normal position.
MCP, PIP, and DIP joints appear normal.  No degenerative change is
seen and no erosion is noted.
IMPRESSION: Negative right hand.

## 2012-05-10 IMAGING — CR DG HAND 2V*L*
1 series · 1 of 1 positions shown · non-contrast
Comparison: None.

CLINICAL DATA: Left hand pain with swelling, no trauma

LEFT HAND - 2 VIEW

[view not recorded]
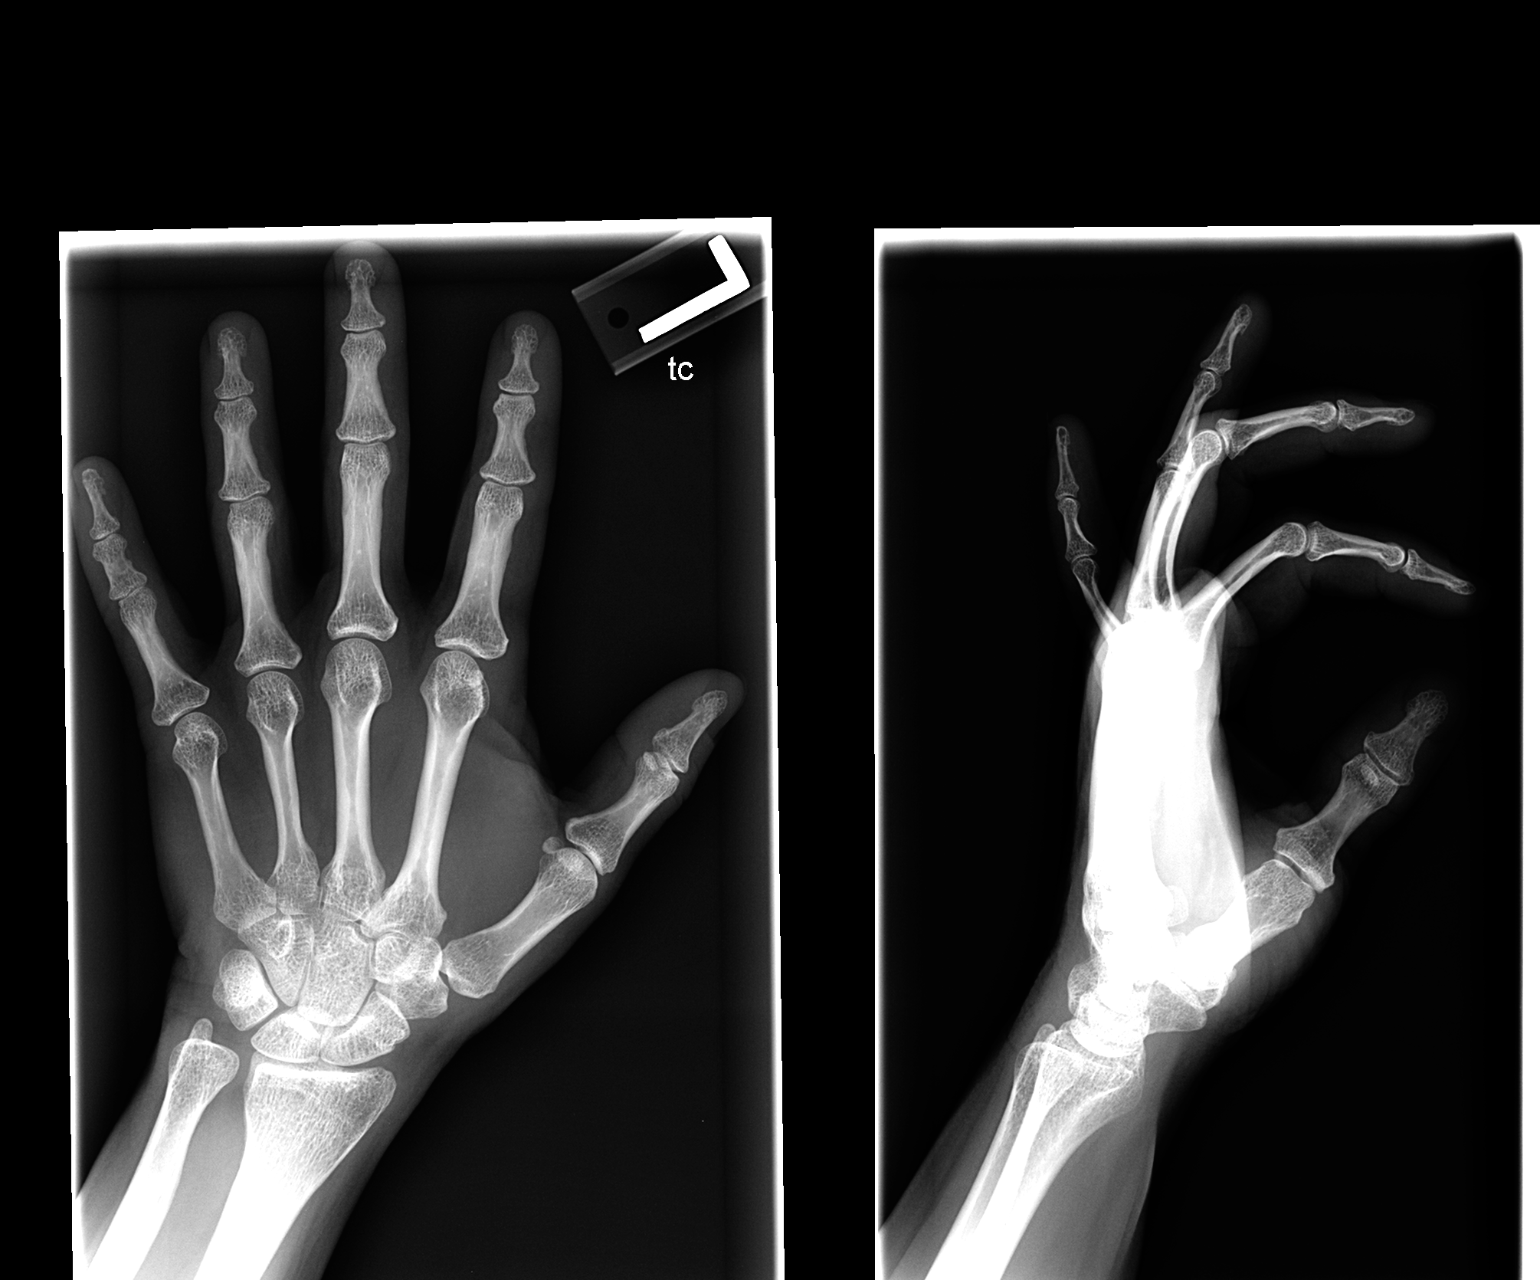

[1 of 1 positions shown; findings below may reference images not displayed]

FINDINGS: The radiocarpal joint space appears normal.  The carpal
bones are in normal position.  The ulnar styloid is intact.  MCP,
PIP, and DIP joints appear normal with no significant degenerative
change.  No erosion is seen.
IMPRESSION: Negative left hand.

## 2012-08-21 ENCOUNTER — Other Ambulatory Visit: Payer: Self-pay | Admitting: Obstetrics & Gynecology

## 2012-08-21 DIAGNOSIS — N644 Mastodynia: Secondary | ICD-10-CM

## 2012-09-16 ENCOUNTER — Encounter: Payer: Self-pay | Admitting: Obstetrics & Gynecology

## 2012-09-16 ENCOUNTER — Ambulatory Visit (INDEPENDENT_AMBULATORY_CARE_PROVIDER_SITE_OTHER): Payer: Commercial Managed Care - PPO | Admitting: Obstetrics & Gynecology

## 2012-09-16 VITALS — BP 109/70 | HR 90 | Temp 98.5°F | Resp 16 | Ht 62.0 in | Wt 132.0 lb

## 2012-09-16 DIAGNOSIS — Z01419 Encounter for gynecological examination (general) (routine) without abnormal findings: Secondary | ICD-10-CM

## 2012-09-16 DIAGNOSIS — Z1151 Encounter for screening for human papillomavirus (HPV): Secondary | ICD-10-CM

## 2012-09-16 DIAGNOSIS — Z124 Encounter for screening for malignant neoplasm of cervix: Secondary | ICD-10-CM

## 2012-09-16 NOTE — Patient Instructions (Signed)
Place premenopausal annual exam patient instructions here.  °

## 2012-09-16 NOTE — Progress Notes (Signed)
  Subjective:     Ashley Barton is a 47 y.o. female here for a routine exam.  Current complaints: none.  Personal health questionnaire reviewed: yes.  Recent surgeries since last visit:  Cataracts.  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     Gynecologic History No LMP recorded. Patient has had an ablation. Contraception: vasectomy Last Pap: 2011. Results were: normal Last mammogram: 2013. Has next mammo scheduled in [place] in [place].  Pt reports last mammo as nml.  Obstetric History OB History    Grav Para Term Preterm Abortions TAB SAB Ect Mult Living   2 2        2      # Outc Date GA Lbr Len/2nd Wgt Sex Del Anes PTL Lv   1 PAR            2 PAR                The following portions of the patient's history were reviewed and updated as appropriate: allergies, current medications, past family history, past medical history, past social history, past surgical history and problem list.  Review of Systems A comprehensive review of systems was negative.    Objective:   Filed Vitals:   09/16/12 1348  BP: 109/70  Pulse: 90  Temp: 98.5 F (36.9 C)  TempSrc: Oral  Resp: 16  Height: 5\' 2"  (1.575 m)  Weight: 132 lb (59.875 kg)     Vitals:  WNL General appearance: alert, cooperative and no distress Head: Normocephalic, without obvious abnormality, atraumatic Eyes: negative Throat: lips, mucosa, and tongue normal; teeth and gums normal Lungs: clear to auscultation bilaterally Breasts: normal appearance, no masses or tenderness, No nipple retraction or dimpling, No nipple discharge or bleeding.  Implants present. Heart: regular rate and rhythm Abdomen: soft, non-tender; bowel sounds normal; no masses,  no organomegaly Pelvic: cervix normal in appearance, external genitalia normal, no adnexal masses or tenderness, no bladder tenderness, no cervical motion tenderness, perianal skin: no external genital warts noted, rectovaginal septum  normal, urethra without abnormality or discharge, uterus normal size, shape, and consistency and vagina normal without discharge Extremities: no edema, redness or tenderness in the calves or thighs Skin: no lesions or rash Lymph nodes: Axillary adenopathy: none       Assessment:    Healthy female exam.    Plan:    Education reviewed: low fat, low cholesterol diet and weight bearing exercise. Contraception: vasectomy. Mammogram ordered. Follow up in: 1 year. Next pap due in 3 yrs if cotesting is negative.

## 2012-10-22 ENCOUNTER — Ambulatory Visit
Admission: RE | Admit: 2012-10-22 | Discharge: 2012-10-22 | Disposition: A | Discharge status: Home / Self Care | Payer: Commercial Managed Care - PPO | Source: Ambulatory Visit | Attending: Obstetrics & Gynecology | Admitting: Obstetrics & Gynecology

## 2012-10-22 DIAGNOSIS — N644 Mastodynia: Secondary | ICD-10-CM

## 2012-11-14 ENCOUNTER — Other Ambulatory Visit: Payer: Self-pay | Admitting: *Deleted

## 2012-11-14 DIAGNOSIS — F419 Anxiety disorder, unspecified: Secondary | ICD-10-CM

## 2012-11-14 MED ORDER — FLUOXETINE HCL 20 MG PO CAPS: 20.0000 mg | ORAL_CAPSULE | Freq: Every day | ORAL | Status: DC

## 2012-11-14 NOTE — Telephone Encounter (Signed)
Pharmacy request for Prozac 20mg  authorized as pt just had an appt with Dr Penne Lash this year.

## 2013-08-12 ENCOUNTER — Other Ambulatory Visit: Payer: Self-pay | Admitting: Obstetrics & Gynecology

## 2014-03-18 ENCOUNTER — Other Ambulatory Visit: Payer: Self-pay | Admitting: Obstetrics & Gynecology

## 2014-03-18 ENCOUNTER — Encounter: Payer: Self-pay | Admitting: *Deleted

## 2014-03-18 ENCOUNTER — Other Ambulatory Visit: Payer: Self-pay | Admitting: *Deleted

## 2014-03-18 DIAGNOSIS — Z780 Asymptomatic menopausal state: Secondary | ICD-10-CM

## 2014-03-18 MED ORDER — FLUOXETINE HCL 20 MG PO CAPS: ORAL_CAPSULE | ORAL | Status: DC

## 2014-03-18 NOTE — Telephone Encounter (Signed)
RF on Prozac given per Dr Gala Romney.  Pt aware she needs appt.

## 2014-04-22 ENCOUNTER — Ambulatory Visit (INDEPENDENT_AMBULATORY_CARE_PROVIDER_SITE_OTHER): Payer: Commercial Managed Care - PPO | Admitting: Obstetrics & Gynecology

## 2014-04-22 ENCOUNTER — Encounter: Payer: Self-pay | Admitting: Obstetrics & Gynecology

## 2014-04-22 VITALS — BP 118/74 | HR 102 | Resp 16 | Ht 62.0 in | Wt 138.0 lb

## 2014-04-22 DIAGNOSIS — Z1151 Encounter for screening for human papillomavirus (HPV): Secondary | ICD-10-CM

## 2014-04-22 DIAGNOSIS — Z0000 Encounter for general adult medical examination without abnormal findings: Secondary | ICD-10-CM

## 2014-04-22 DIAGNOSIS — Z780 Asymptomatic menopausal state: Secondary | ICD-10-CM

## 2014-04-22 DIAGNOSIS — N951 Menopausal and female climacteric states: Secondary | ICD-10-CM

## 2014-04-22 DIAGNOSIS — Z01419 Encounter for gynecological examination (general) (routine) without abnormal findings: Secondary | ICD-10-CM

## 2014-04-22 DIAGNOSIS — Z124 Encounter for screening for malignant neoplasm of cervix: Secondary | ICD-10-CM

## 2014-04-22 MED ORDER — FLUOXETINE HCL 20 MG PO CAPS: ORAL_CAPSULE | ORAL | Status: AC

## 2014-04-22 NOTE — Addendum Note (Signed)
Addended by: Asencion Islam on: 04/22/2014 02:21 PM   Modules accepted: Orders

## 2014-04-22 NOTE — Progress Notes (Signed)
Subjective:    Ashley Barton is a 48 y.o. MW G2P2 (74 yo and 55 yo sons) female who presents for an annual exam. The patient has no complaints today. The patient is sexually active. GYN screening history: last pap: was normal. The patient wears seatbelts: yes. The patient participates in regular exercise: no. Has the patient ever been transfused or tattooed?: no. The patient reports that there is not domestic violence in her life.   Menstrual History: OB History   Grav Para Term Preterm Abortions TAB SAB Ect Mult Living   2 2 2       2       Menarche age: 18  No LMP recorded. Patient has had an ablation.    The following portions of the patient's history were reviewed and updated as appropriate: allergies, current medications, past family history, past medical history, past social history, past surgical history and problem list.  Review of Systems A comprehensive review of systems was negative. Married for 23 years, no dyspareunia. No lube needed. Her husband has had a vasectomy. 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 Mammogram UTD and normal/required follow up. Unknown FH   Objective:    BP 118/74  Pulse 102  Resp 16  Ht 5\' 2"  (1.575 m)  Wt 138 lb (62.596 kg)  BMI 25.23 kg/m2  General Appearance:    Alert, cooperative, no distress, appears stated age  Head:    Normocephalic, without obvious abnormality, atraumatic  Eyes:    PERRL, conjunctiva/corneas clear, EOM's intact, fundi    benign, both eyes  Ears:    Normal TM's and external ear canals, both ears  Nose:   Nares normal, septum midline, mucosa normal, no drainage    or sinus tenderness  Throat:   Lips, mucosa, and tongue normal; teeth and gums normal  Neck:   Supple, symmetrical, trachea midline, no adenopathy;    thyroid:  no enlargement/tenderness/nodules; no carotid   bruit or JVD  Back:     Symmetric, no curvature, ROM normal, no CVA tenderness  Lungs:     Clear to auscultation bilaterally,  respirations unlabored  Chest Wall:    No tenderness or deformity   Heart:    Regular rate and rhythm, S1 and S2 normal, no murmur, rub   or gallop  Breast Exam:    No tenderness, masses, or nipple abnormality  Abdomen:     Soft, non-tender, bowel sounds active all four quadrants,    no masses, no organomegaly  Genitalia:    Normal female without lesion, discharge or tenderness, NSSA, NT, mobile, normal adnexal exam     Extremities:   Extremities normal, atraumatic, no cyanosis or edema  Pulses:   2+ and symmetric all extremities  Skin:   Skin color, texture, turgor normal, no rashes or lesions  Lymph nodes:   Cervical, supraclavicular, and axillary nodes normal  Neurologic:   CNII-XII intact, normal strength, sensation and reflexes    throughout  .    Assessment:    Healthy female exam.    Plan:     Breast self exam technique reviewed and patient encouraged to perform self-exam monthly. Thin prep Pap smear. with cotesting

## 2014-04-22 NOTE — Addendum Note (Signed)
Addended by: Emily Filbert on: 04/22/2014 02:09 PM   Modules accepted: Orders

## 2014-04-26 LAB — CYTOLOGY - PAP

## 2014-06-28 ENCOUNTER — Encounter: Payer: Self-pay | Admitting: Obstetrics & Gynecology
